# Patient Record
Sex: Male | Born: 1980 | Race: White | Hispanic: No | Marital: Single | Smoking: Former smoker
Health system: Southern US, Community
[De-identification: ages and names within clinical notes are randomized; demographics above are authoritative.]

## PROBLEM LIST (undated history)

## (undated) ENCOUNTER — Ambulatory Visit: Discharge status: Absent without leave | Payer: Medicaid Other

## (undated) DIAGNOSIS — J449 Chronic obstructive pulmonary disease, unspecified: Secondary | ICD-10-CM

## (undated) DIAGNOSIS — J45909 Unspecified asthma, uncomplicated: Secondary | ICD-10-CM

## (undated) DIAGNOSIS — D179 Benign lipomatous neoplasm, unspecified: Secondary | ICD-10-CM

---

## 2004-11-17 ENCOUNTER — Emergency Department: Payer: Self-pay | Admitting: Emergency Medicine

## 2005-03-22 ENCOUNTER — Emergency Department: Payer: Self-pay | Admitting: Emergency Medicine

## 2005-06-05 ENCOUNTER — Emergency Department: Payer: Self-pay | Admitting: Emergency Medicine

## 2007-02-12 ENCOUNTER — Emergency Department: Payer: Self-pay | Admitting: Emergency Medicine

## 2007-02-13 ENCOUNTER — Inpatient Hospital Stay: Payer: Self-pay | Admitting: Internal Medicine

## 2008-02-02 ENCOUNTER — Emergency Department: Payer: Self-pay | Admitting: Internal Medicine

## 2008-02-26 ENCOUNTER — Emergency Department: Payer: Self-pay | Admitting: Emergency Medicine

## 2008-03-24 ENCOUNTER — Emergency Department: Payer: Self-pay | Admitting: Emergency Medicine

## 2010-12-08 ENCOUNTER — Emergency Department (HOSPITAL_COMMUNITY)
Admission: EM | Admit: 2010-12-08 | Discharge: 2010-12-08 | Payer: Self-pay | Source: Home / Self Care | Admitting: Emergency Medicine

## 2011-01-05 ENCOUNTER — Emergency Department (HOSPITAL_COMMUNITY)
Admission: EM | Admit: 2011-01-05 | Discharge: 2011-01-05 | Payer: Self-pay | Source: Home / Self Care | Admitting: Emergency Medicine

## 2017-05-06 ENCOUNTER — Encounter: Payer: Self-pay | Admitting: Emergency Medicine

## 2017-05-06 ENCOUNTER — Ambulatory Visit
Admission: EM | Admit: 2017-05-06 | Discharge: 2017-05-06 | Disposition: A | Discharge status: Home / Self Care | Payer: Medicaid Other | Attending: Emergency Medicine | Admitting: Emergency Medicine

## 2017-05-06 DIAGNOSIS — R05 Cough: Secondary | ICD-10-CM

## 2017-05-06 DIAGNOSIS — J40 Bronchitis, not specified as acute or chronic: Secondary | ICD-10-CM

## 2017-05-06 DIAGNOSIS — R059 Cough, unspecified: Secondary | ICD-10-CM

## 2017-05-06 MED ORDER — HYDROCOD POLST-CPM POLST ER 10-8 MG/5ML PO SUER: 5.0000 mL | Freq: Two times a day (BID) | ORAL | 0 refills | Status: DC

## 2017-05-06 MED ORDER — BENZONATATE 200 MG PO CAPS: ORAL_CAPSULE | ORAL | 0 refills | Status: DC

## 2017-05-06 NOTE — ED Triage Notes (Signed)
Patient c/o cough and chest congestion for the past 4 days.  Patient reports sinus and nasal congestion for a week.

## 2017-05-06 NOTE — ED Provider Notes (Signed)
CSN: [id number]     Arrival date & time 05/06/17  1953 History   First MD Initiated Contact with Patient 05/06/17 2117     Chief Complaint  Patient presents with  . Cough  . Nasal Congestion   (Consider location/radiation/quality/duration/timing/severity/associated sxs/prior Treatment) HPI  This a 36 year old male who presents with cough and chest congestion for the past 4 days. States he's had sinus and nasal congestion for over a week. Sates this time each year he gets this almost without fail. Denies fever or chills. Been using over-the-counter medications without help. He states that he is miserable at nighttime is unable to sleep. Usually requires codeine cough syrup to allow him to get rest. Checked him on the [place] substance abuse registry and there are no red flags.        History reviewed. No pertinent past medical history. History reviewed. No pertinent surgical history. Family History  Problem Relation Age of Onset  . Diabetes Mother   . Cancer Father    Social History  Substance Use Topics  . Smoking status: Current Every Day Smoker    Types: Cigarettes  . Smokeless tobacco: Never Used  . Alcohol use Yes    Review of Systems  Constitutional: Positive for activity change. Negative for chills, fatigue and fever.  HENT: Positive for postnasal drip, sinus pressure and sore throat.   Respiratory: Positive for cough. Negative for shortness of breath, wheezing and stridor.   All other systems reviewed and are negative.   Allergies  Patient has no known allergies.  Home Medications   Prior to Admission medications   Medication Sig Start Date End Date Taking? Authorizing Provider  benzonatate (TESSALON) 200 MG capsule Take one cap TID PRN cough 05/06/17   Lorin Picket, PA-C  chlorpheniramine-HYDROcodone [facility]) 10-8 MG/5ML SUER Take 5 mLs by mouth 2 (two) times daily. 05/06/17   Lorin Picket, PA-C   Meds Ordered and Administered  this Visit  Medications - No data to display  BP 123/88 (BP Location: Left Arm)   Pulse 63   Temp 97.8 F (36.6 C) (Oral)   Resp 16   Ht 5\' 9"  (1.753 m)   Wt 155 lb (70.3 kg)   SpO2 100%   BMI 22.89 kg/m  No data found.   Physical Exam  Constitutional: He is oriented to person, place, and time. He appears well-developed and well-nourished. No distress.  HENT:  Head: Normocephalic.  Mouth/Throat: Oropharynx is clear and moist. No oropharyngeal exudate.  Both ear canals are occluded with cerumen. He has no significant tenderness over the sinuses to percussion. Oropharynx is benign  Eyes: Pupils are equal, round, and reactive to light. Right eye exhibits no discharge. Left eye exhibits no discharge.  Neck: Normal range of motion.  Pulmonary/Chest: Effort normal and breath sounds normal. No respiratory distress. He has no wheezes. He has no rales.  Musculoskeletal: Normal range of motion.  Lymphadenopathy:    He has no cervical adenopathy.  Neurological: He is alert and oriented to person, place, and time.  Skin: Skin is warm and dry. He is not diaphoretic.  Psychiatric: He has a normal mood and affect. His behavior is normal. Judgment and thought content normal.  Nursing note and vitals reviewed.   Urgent Care Course     Procedures (including critical care time)  Labs Review Labs Reviewed - No data to display  Imaging Review No results found.   Visual Acuity Review  Right Eye Distance:  Left Eye Distance:   Bilateral Distance:    Right Eye Near:   Left Eye Near:    Bilateral Near:         MDM   1. Bronchitis   2. Cough    Discharge Medication List as of 05/06/2017  9:33 PM    START taking these medications   Details  benzonatate (TESSALON) 200 MG capsule Take one cap TID PRN cough, Print    chlorpheniramine-HYDROcodone (TUSSIONEX PENNKINETIC ER) 10-8 MG/5ML SUER Take 5 mLs by mouth 2 (two) times daily., Starting Fri 05/06/2017, Print       Plan: 1. Test/x-ray results and diagnosis reviewed with patient 2. rx as per orders; risks, benefits, potential side effects reviewed with patient 3. Recommend supportive treatment with Use of Flonase and Zyrtec throughout the entire pollen season. Have also given him Tessalon Perles for daytime use. He is cautioned regarding use of the Tussionex with activities requiring judgment or concentration and not to drive for taking it. 4. F/u prn if symptoms worsen or don't improve     Lorin Picket, PA-C 05/06/17 2140

## 2019-12-02 ENCOUNTER — Ambulatory Visit
Admission: EM | Admit: 2019-12-02 | Discharge: 2019-12-02 | Disposition: A | Discharge status: Home / Self Care | Payer: Medicaid Other | Attending: Family Medicine | Admitting: Family Medicine

## 2019-12-02 ENCOUNTER — Other Ambulatory Visit: Payer: Self-pay

## 2019-12-02 ENCOUNTER — Encounter: Payer: Self-pay | Admitting: Emergency Medicine

## 2019-12-02 DIAGNOSIS — F411 Generalized anxiety disorder: Secondary | ICD-10-CM | POA: Insufficient documentation

## 2019-12-02 DIAGNOSIS — R002 Palpitations: Secondary | ICD-10-CM | POA: Diagnosis not present

## 2019-12-02 DIAGNOSIS — R0981 Nasal congestion: Secondary | ICD-10-CM | POA: Diagnosis not present

## 2019-12-02 DIAGNOSIS — Z20828 Contact with and (suspected) exposure to other viral communicable diseases: Secondary | ICD-10-CM

## 2019-12-02 DIAGNOSIS — Z20822 Contact with and (suspected) exposure to covid-19: Secondary | ICD-10-CM

## 2019-12-02 MED ORDER — CLONAZEPAM 0.5 MG PO TABS: 0.5000 mg | ORAL_TABLET | Freq: Two times a day (BID) | ORAL | 0 refills | Status: AC | PRN

## 2019-12-02 MED ORDER — IPRATROPIUM BROMIDE 0.06 % NA SOLN: 2.0000 | Freq: Four times a day (QID) | NASAL | 0 refills | Status: DC | PRN

## 2019-12-02 NOTE — ED Triage Notes (Signed)
Patient c/o sinus congestion and nasal congestion that started 2-3 days ago.  Patient c/o SOB and palpitation that started couple of hours ago.  Patient denies chest pain.  Patient was seen at [facility] ED and had normal EKG, negative chest x-ray and refused COVID test.

## 2019-12-02 NOTE — Discharge Instructions (Signed)
Rest.  Medication as needed.  Take care  Dr. Myrle Wanek  

## 2019-12-03 LAB — NOVEL CORONAVIRUS, NAA (HOSP ORDER, SEND-OUT TO REF LAB; TAT 18-24 HRS): SARS-CoV-2, NAA: NOT DETECTED

## 2019-12-03 NOTE — ED Provider Notes (Signed)
[facility]    CSN: [id number] Arrival date & time: 12/02/19  1535  History   Chief Complaint Chief Complaint  Patient presents with  . Sinus Problem  . Nasal Congestion  . Shortness of Breath    HPI  38 year old male presents with the above complaints.  Patient reports that symptoms started 2 to 3 days ago.  Patient reports sinus congestion, nasal congestion, shortness of breath, and palpitations.  Shortness of breath and palpitations started a few hours ago.  He has recently been seen at [facility].  Had EKG as well as chest x-ray at that time.  Refused Covid test.  Patient presents today for evaluation.  Continues to not feel well.  No fever.  He said that his symptoms are secondary to COVID-19.  No known exposures.  No known exacerbating or relieving factors.  No other complaints or concerns at this time.  Hx reviewed as below. PMH: Asthma, Anxiety  Home Medications    Prior to Admission medications   Medication Sig Start Date End Date Taking? Authorizing Provider  albuterol (VENTOLIN HFA) 108 (90 Base) MCG/ACT inhaler Inhale into the lungs. 06/01/19 05/31/20 Yes [provider]  famotidine (PEPCID) 20 MG tablet Take by mouth. 10/31/19 10/30/20 Yes [provider]  clonazePAM (KLONOPIN) 0.5 MG tablet Take 1 tablet (0.5 mg total) by mouth 2 (two) times daily as needed for anxiety. 12/02/19   Thersa Salt G, DO  ipratropium (ATROVENT) 0.06 % nasal spray Place 2 sprays into both nostrils 4 (four) times daily as needed for rhinitis. 12/02/19   Coral Spikes, DO    Family History Family History  Problem Relation Age of Onset  . Diabetes Mother   . Cancer Father     Social History Social History   Tobacco Use  . Smoking status: Former Smoker    Types: Cigarettes  . Smokeless tobacco: Never Used  Substance Use Topics  . Alcohol use: Yes  . Drug use: Yes    Types: Marijuana     Allergies   Patient has no known  allergies.   Review of Systems Review of Systems  Constitutional: Negative for fever.  HENT: Positive for congestion.   Respiratory: Positive for shortness of breath.   Cardiovascular: Positive for palpitations.   Physical Exam Triage Vital Signs ED Triage Vitals [12/02/19 1548]  Enc Vitals Group     BP 118/88     Pulse Rate 74     Resp 16     Temp 98.1 F (36.7 C)     Temp Source Oral     SpO2 100 %     Weight 155 lb (70.3 kg)     Height 5\' 9"  (1.753 m)     Head Circumference      Peak Flow      Pain Score 0     Pain Loc      Pain Edu?      Excl. in [place]?    Updated Vital Signs BP 118/88 (BP Location: Left Arm)   Pulse 74   Temp 98.1 F (36.7 C) (Oral)   Resp 16   Ht 5\' 9"  (1.753 m)   Wt 70.3 kg   SpO2 100%   BMI 22.89 kg/m   Visual Acuity Right Eye Distance:   Left Eye Distance:   Bilateral Distance:    Right Eye Near:   Left Eye Near:    Bilateral Near:     Physical Exam Vitals and nursing note reviewed.  Constitutional:      General: He is not in acute distress.    Appearance: Normal appearance. He is not ill-appearing.  HENT:     Head: Normocephalic and atraumatic.     Right Ear: Tympanic membrane normal.     Left Ear: Tympanic membrane normal.     Nose: No rhinorrhea.     Mouth/Throat:     Pharynx: Oropharynx is clear. No posterior oropharyngeal erythema.  Eyes:     General:        Right eye: No discharge.        Left eye: No discharge.     Conjunctiva/sclera: Conjunctivae normal.  Cardiovascular:     Rate and Rhythm: Normal rate and regular rhythm.     Heart sounds: No murmur.  Pulmonary:     Effort: Pulmonary effort is normal.     Breath sounds: Normal breath sounds. No wheezing, rhonchi or rales.  Skin:    General: Skin is warm.     Findings: No rash.  Neurological:     Mental Status: He is alert.  Psychiatric:        Mood and Affect: Mood normal.        Behavior: Behavior normal.    UC Treatments / Results  Labs (all labs  ordered are listed, but only abnormal results are displayed) Labs Reviewed  NOVEL CORONAVIRUS, NAA (HOSP ORDER, SEND-OUT TO REF LAB; TAT 18-24 HRS)    EKG Interpretation: Normal sinus rhythm with a rate of 65.  Normal axis.  Normal intervals.  No ST or T wave changes.  Normal EKG.  Radiology No results found.  Procedures Procedures (including critical care time)  Medications Ordered in UC Medications - No data to display  Initial Impression / Assessment and Plan / UC Course  I have reviewed the triage vital signs and the nursing notes.  Pertinent labs & imaging results that were available during my care of the patient were reviewed by me and considered in my medical decision making (see chart for details).    38 year old male presents with multiple complaints.  EKG normal.  Has had a recent chest x-ray which was normal.  He is well-appearing on exam.  Awaiting Covid test results.  I have a low suspicion that he has COVID-19.  Patient appears to be suffering from anxiety.  Short supply of Klonopin given.  Supportive care.  Atrovent nasal spray for congestion.  Final Clinical Impressions(s) / UC Diagnoses   Final diagnoses:  Palpitations  Sinus congestion  Encounter for laboratory testing for COVID-19 virus  Anxiety state     Discharge Instructions     Rest.  Medication as needed.  Take care  Dr. Lacinda Axon    ED Prescriptions    Medication Sig Dispense Auth. Provider   clonazePAM (KLONOPIN) 0.5 MG tablet Take 1 tablet (0.5 mg total) by mouth 2 (two) times daily as needed for anxiety. 10 tablet Copper Kirtley G, DO   ipratropium (ATROVENT) 0.06 % nasal spray Place 2 sprays into both nostrils 4 (four) times daily as needed for rhinitis. 15 mL Coral Spikes, DO     PDMP not reviewed this encounter.   Coral Spikes, [place] 12/03/19 1126

## 2021-03-10 ENCOUNTER — Ambulatory Visit
Admission: EM | Admit: 2021-03-10 | Discharge: 2021-03-10 | Disposition: A | Discharge status: Home / Self Care | Payer: Medicaid Other

## 2021-03-10 ENCOUNTER — Other Ambulatory Visit: Payer: Self-pay

## 2021-03-10 ENCOUNTER — Encounter: Payer: Self-pay | Admitting: Emergency Medicine

## 2021-03-10 DIAGNOSIS — J069 Acute upper respiratory infection, unspecified: Secondary | ICD-10-CM | POA: Diagnosis not present

## 2021-03-10 HISTORY — DX: Benign lipomatous neoplasm, unspecified: D17.9

## 2021-03-10 HISTORY — DX: Unspecified asthma, uncomplicated: J45.909

## 2021-03-10 HISTORY — DX: Chronic obstructive pulmonary disease, unspecified: J44.9

## 2021-03-10 MED ORDER — PROMETHAZINE-DM 6.25-15 MG/5ML PO SYRP: 5.0000 mL | ORAL_SOLUTION | Freq: Four times a day (QID) | ORAL | 0 refills | Status: DC | PRN

## 2021-03-10 MED ORDER — AEROCHAMBER MV MISC: 2 refills | Status: DC

## 2021-03-10 MED ORDER — DOXYCYCLINE HYCLATE 100 MG PO CAPS: 100.0000 mg | ORAL_CAPSULE | Freq: Two times a day (BID) | ORAL | 0 refills | Status: DC

## 2021-03-10 MED ORDER — AEROCHAMBER MV MISC: 2 refills | Status: AC

## 2021-03-10 NOTE — Discharge Instructions (Signed)
The doxycycline twice daily with food for 10 days.  Use the Promethazine DM cough syrup, 1 teaspoon every 6 hours, as needed for cough and congestion.  This will make you drowsy.  Perform sinus irrigation with a NeilMed sinus rinse kit and distilled water 2-3 times a day to help wash the mucus out of your sinuses and help resolve your infection.  She did use over-the-counter Mucinex to help break up the stickiness of your mucus and allow your body to clear it better.  Use your albuterol inhaler with spacer, 2 puffs every 4-6 hours, as needed for wheezing.  Return for new or worsening symptoms.

## 2021-03-10 NOTE — ED Triage Notes (Signed)
Patient c/o nasal congestion, sneezing, cough that started 3 days ago. He hadn't been taking his allergy medication in the last few days but did restart this.

## 2021-03-10 NOTE — ED Provider Notes (Signed)
[facility]    CSN: [id number] Arrival date & time: 03/10/21  1013      History   Chief Complaint Chief Complaint  Patient presents with  . Cough  . Nasal Congestion    HPI Murvin Schlick is a 40 y.o. male.   HPI   40 year old male here for evaluation of nasal congestion, cough, chest congestion.  Patient reports that he has been experiencing these symptoms for last 3 days.  Patient reports that he gets this same sort of presentation seasonally and is usually treated with cough medication and a round of antibiotics.  Patient reports that he has been using Mucinex to help break up the mucus but it is very thick and tenacious.  Patient has history of asthma and has been experiencing some wheezing for which she is using his rescue inhaler with success.  Patient also has had some ear pressure, green nasal discharge, productive cough for green sputum, but has not had any shortness of breath or fever.  Past Medical History:  Diagnosis Date  . Asthma   . COPD (chronic obstructive pulmonary disease) ([place])   . Multiple lipomas     There are no problems to display for this patient.   History reviewed. No pertinent surgical history.     Home Medications    Prior to Admission medications   Medication Sig Start Date End Date Taking? Authorizing Provider  albuterol (VENTOLIN HFA) 108 (90 Base) MCG/ACT inhaler Inhale into the lungs. 06/01/19 03/10/21 Yes [provider]  doxycycline (VIBRAMYCIN) 100 MG capsule Take 1 capsule (100 mg total) by mouth 2 (two) times daily. 03/10/21  Yes Margarette Canada, NP  fexofenadine-pseudoephedrine (ALLEGRA-D 24) 180-240 MG 24 hr tablet Take 1 tablet by mouth daily.   Yes [provider]  promethazine-dextromethorphan (PROMETHAZINE-DM) 6.25-15 MG/5ML syrup Take 5 mLs by mouth 4 (four) times daily as needed. 03/10/21  Yes Margarette Canada, NP  Spacer/Aero-Holding Chambers (AEROCHAMBER MV) inhaler Use as instructed 03/10/21  Yes  Margarette Canada, NP  clonazePAM (KLONOPIN) 0.5 MG tablet Take 1 tablet (0.5 mg total) by mouth 2 (two) times daily as needed for anxiety. 12/02/19   Coral Spikes, DO  famotidine (PEPCID) 20 MG tablet Take by mouth. 10/31/19 10/30/20  [provider]  ipratropium (ATROVENT) 0.06 % nasal spray Place 2 sprays into both nostrils 4 (four) times daily as needed for rhinitis. 12/02/19   Coral Spikes, DO    Family History Family History  Problem Relation Age of Onset  . Diabetes Mother   . Cancer Father     Social History Social History   Tobacco Use  . Smoking status: Former Smoker    Types: Cigarettes  . Smokeless tobacco: Never Used  Vaping Use  . Vaping Use: Never used  Substance Use Topics  . Alcohol use: Not Currently    Comment: social  . Drug use: Yes    Types: Marijuana     Allergies   Patient has no known allergies.   Review of Systems Review of Systems  Constitutional: Negative for activity change, appetite change and fever.  HENT: Positive for congestion, ear pain, postnasal drip and rhinorrhea. Negative for sore throat.   Respiratory: Positive for cough and wheezing. Negative for shortness of breath.   Skin: Negative for rash.  Hematological: Negative.   Psychiatric/Behavioral: Negative.      Physical Exam Triage Vital Signs ED Triage Vitals  Enc Vitals Group     BP 03/10/21 1131 (!) 124/95  Pulse Rate 03/10/21 1131 69     Resp 03/10/21 1131 18     Temp 03/10/21 1131 98 F (36.7 C)     Temp Source 03/10/21 1131 Oral     SpO2 03/10/21 1131 100 %     Weight 03/10/21 1131 150 lb (68 kg)     Height 03/10/21 1131 5' 9"  (1.753 m)     Head Circumference --      Peak Flow --      Pain Score 03/10/21 1129 0     Pain Loc --      Pain Edu? --      Excl. in [place]? --    No data found.  Updated Vital Signs BP (!) 124/95 (BP Location: Right Arm)   Pulse 69   Temp 98 F (36.7 C) (Oral)   Resp 18   Ht 5' 9"  (1.753 m)   Wt 150 lb (68 kg)   SpO2  100%   BMI 22.15 kg/m   Visual Acuity Right Eye Distance:   Left Eye Distance:   Bilateral Distance:    Right Eye Near:   Left Eye Near:    Bilateral Near:     Physical Exam Vitals and nursing note reviewed.  Constitutional:      General: He is not in acute distress.    Appearance: Normal appearance. He is ill-appearing.  HENT:     Head: Normocephalic and atraumatic.     Right Ear: Tympanic membrane, ear canal and external ear normal.     Left Ear: Tympanic membrane, ear canal and external ear normal.     Nose: Congestion and rhinorrhea present.     Mouth/Throat:     Pharynx: No posterior oropharyngeal erythema.  Cardiovascular:     Rate and Rhythm: Normal rate and regular rhythm.     Pulses: Normal pulses.     Heart sounds: Normal heart sounds. No murmur heard. No gallop.   Pulmonary:     Effort: Pulmonary effort is normal.     Breath sounds: Rales present. No wheezing or rhonchi.  Skin:    General: Skin is warm and dry.     Capillary Refill: Capillary refill takes less than 2 seconds.     Findings: No erythema or rash.  Neurological:     General: No focal deficit present.     Mental Status: He is alert and oriented to person, place, and time.  Psychiatric:        Mood and Affect: Mood normal.        Behavior: Behavior normal.        Thought Content: Thought content normal.        Judgment: Judgment normal.      UC Treatments / Results  Labs (all labs ordered are listed, but only abnormal results are displayed) Labs Reviewed - No data to display  EKG   Radiology No results found.  Procedures Procedures (including critical care time)  Medications Ordered in UC Medications - No data to display  Initial Impression / Assessment and Plan / UC Course  I have reviewed the triage vital signs and the nursing notes.  Pertinent labs & imaging results that were available during my care of the patient were reviewed by me and considered in my medical decision  making (see chart for details).   Sent but ill-appearing 40 year old male here for evaluation of respiratory complaints that been going on for the past 3 days.  Patient's physical exam reveals erythematous and edematous  nasal mucosa with clear nasal discharge.  Bilateral tympanic membranes are pearly gray with a normal light reflex in both external auditory canals are clear.  Patient's posterior oropharynx is unremarkable other than some clear postnasal drip.  Patient does have a strong infectious odor on his breath.  Lung sounds reveal some fine crackles in the right lung base.  Will cover patient for possible infectious sources with doxycycline twice daily for 10 days, will have him continue his Mucinex, perform sinus irrigation, and will give Promethazine DM for cough.   Final Clinical Impressions(s) / UC Diagnoses   Final diagnoses:  Upper respiratory tract infection, unspecified type     Discharge Instructions     The doxycycline twice daily with food for 10 days.  Use the Promethazine DM cough syrup, 1 teaspoon every 6 hours, as needed for cough and congestion.  This will make you drowsy.  Perform sinus irrigation with a NeilMed sinus rinse kit and distilled water 2-3 times a day to help wash the mucus out of your sinuses and help resolve your infection.  She did use over-the-counter Mucinex to help break up the stickiness of your mucus and allow your body to clear it better.  Use your albuterol inhaler with spacer, 2 puffs every 4-6 hours, as needed for wheezing.  Return for new or worsening symptoms.    ED Prescriptions    Medication Sig Dispense Auth. Provider   doxycycline (VIBRAMYCIN) 100 MG capsule Take 1 capsule (100 mg total) by mouth 2 (two) times daily. 20 capsule Margarette Canada, NP   promethazine-dextromethorphan (PROMETHAZINE-DM) 6.25-15 MG/5ML syrup Take 5 mLs by mouth 4 (four) times daily as needed. 118 mL Margarette Canada, NP   Spacer/Aero-Holding Josiah Lobo (AEROCHAMBER  MV) inhaler Use as instructed 1 each Margarette Canada, NP     PDMP not reviewed this encounter.   Margarette Canada, NP 03/10/21 1208

## 2021-05-12 ENCOUNTER — Ambulatory Visit
Admission: EM | Admit: 2021-05-12 | Discharge: 2021-05-12 | Disposition: A | Discharge status: Home / Self Care | Payer: Medicaid Other | Attending: Physician Assistant | Admitting: Physician Assistant

## 2021-05-12 ENCOUNTER — Encounter: Payer: Self-pay | Admitting: Emergency Medicine

## 2021-05-12 ENCOUNTER — Other Ambulatory Visit: Payer: Self-pay

## 2021-05-12 DIAGNOSIS — W57XXXA Bitten or stung by nonvenomous insect and other nonvenomous arthropods, initial encounter: Secondary | ICD-10-CM | POA: Diagnosis not present

## 2021-05-12 DIAGNOSIS — S70361A Insect bite (nonvenomous), right thigh, initial encounter: Secondary | ICD-10-CM

## 2021-05-12 DIAGNOSIS — M791 Myalgia, unspecified site: Secondary | ICD-10-CM

## 2021-05-12 DIAGNOSIS — R5383 Other fatigue: Secondary | ICD-10-CM

## 2021-05-12 MED ORDER — TRIAMCINOLONE ACETONIDE 0.1 % EX CREA: 1.0000 "application " | TOPICAL_CREAM | Freq: Two times a day (BID) | CUTANEOUS | 0 refills | Status: AC

## 2021-05-12 MED ORDER — DOXYCYCLINE HYCLATE 100 MG PO CAPS: 100.0000 mg | ORAL_CAPSULE | Freq: Two times a day (BID) | ORAL | 0 refills | Status: AC

## 2021-05-12 NOTE — Discharge Instructions (Signed)
Take over-the-counter ibuprofen or Aleve for your muscle pains and joint aches.  Rest and increase fluids.  I have sent a corticosteroid ointment for the tick bite sites.  This should help with swelling and discomfort.  I have also sent doxycycline to treat you for possible tickborne illnesses.  He should return if you feel worse or have fevers, increased redness/swelling/pain around the tick bite sites or weakness.  Go to ED for any severe acute worsening of your symptoms.

## 2021-05-12 NOTE — ED Triage Notes (Signed)
Patient states he was bit by a tick a few days ago in 2 separate areas. He is c/o joint pain that started 2 days ago.

## 2021-05-12 NOTE — ED Provider Notes (Signed)
[facility]    CSN: [id number] Arrival date & time: 05/12/21  0850      History   Chief Complaint Chief Complaint  Patient presents with  . Tick Removal    HPI Corey Cohen is a 40 y.o. male presenting for ~2 days of myalgias and arthralgias as well as fatigue.  Patient states that he was in the shower noticed a very small tick on his scrotum.  He says that he pulled this off yesterday.  He is unsure how it was there.  He also states that he found a very small tick on his posterior right thigh.  He does have some minimal redness and swelling in those areas with some itching.  Patient states that his symptoms of myalgias, arthralgias and fatigue started before he noticed he takes.  Patient is concerned for tickborne illnesses.  He has not had any fevers.  No body rashes.  No chest pain or breathing difficulty.  Has not taken any over-the-counter medications for his discomfort.  No other concerns.  HPI  Past Medical History:  Diagnosis Date  . Asthma   . COPD (chronic obstructive pulmonary disease) ([place])   . Multiple lipomas     There are no problems to display for this patient.   History reviewed. No pertinent surgical history.     Home Medications    Prior to Admission medications   Medication Sig Start Date End Date Taking? Authorizing Provider  clonazePAM (KLONOPIN) 0.5 MG tablet Take 1 tablet (0.5 mg total) by mouth 2 (two) times daily as needed for anxiety. 12/02/19  Yes Cook, Jayce G, DO  doxycycline (VIBRAMYCIN) 100 MG capsule Take 1 capsule (100 mg total) by mouth 2 (two) times daily for 14 days. 05/12/21 05/26/21 Yes Danton Clap, PA-C  fexofenadine-pseudoephedrine (ALLEGRA-D 24) 180-240 MG 24 hr tablet Take 1 tablet by mouth daily.   Yes [provider]  triamcinolone cream (KENALOG) 0.1 % Apply 1 application topically 2 (two) times daily for 10 days. 05/12/21 05/22/21 Yes Laurene Footman B, PA-C  albuterol (VENTOLIN HFA) 108 (90 Base) MCG/ACT  inhaler Inhale into the lungs. 06/01/19 03/10/21  [provider]  famotidine (PEPCID) 20 MG tablet Take by mouth. 10/31/19 10/30/20  [provider]  ipratropium (ATROVENT) 0.06 % nasal spray Place 2 sprays into both nostrils 4 (four) times daily as needed for rhinitis. 12/02/19   Coral Spikes, DO  promethazine-dextromethorphan (PROMETHAZINE-DM) 6.25-15 MG/5ML syrup Take 5 mLs by mouth 4 (four) times daily as needed. Patient taking differently: Take 5 mLs by mouth 4 (four) times daily as needed. 03/10/21   Margarette Canada, NP  Spacer/Aero-Holding Josiah Lobo (AEROCHAMBER MV) inhaler Use as instructed 03/10/21   Margarette Canada, NP    Family History Family History  Problem Relation Age of Onset  . Diabetes Mother   . Cancer Father     Social History Social History   Tobacco Use  . Smoking status: Former Smoker    Types: Cigarettes  . Smokeless tobacco: Never Used  Vaping Use  . Vaping Use: Never used  Substance Use Topics  . Alcohol use: Not Currently    Comment: social  . Drug use: Yes    Types: Marijuana     Allergies   Patient has no known allergies.   Review of Systems Review of Systems  Constitutional: Positive for fatigue. Negative for fever.  Respiratory: Negative for shortness of breath.   Cardiovascular: Negative for chest pain.  Gastrointestinal: Negative for nausea and  vomiting.  Musculoskeletal: Positive for arthralgias and myalgias. Negative for joint swelling.  Skin: Positive for color change and rash.  Neurological: Negative for dizziness and headaches.     Physical Exam Triage Vital Signs ED Triage Vitals  Enc Vitals Group     BP 05/12/21 0918 109/78     Pulse Rate 05/12/21 0918 (!) 57     Resp 05/12/21 0918 18     Temp 05/12/21 0918 98.1 F (36.7 C)     Temp Source 05/12/21 0918 Oral     SpO2 05/12/21 0918 100 %     Weight 05/12/21 0916 149 lb 14.6 oz (68 kg)     Height 05/12/21 0916 5\' 9"  (1.753 m)     Head Circumference --       Peak Flow --      Pain Score 05/12/21 0916 5     Pain Loc --      Pain Edu? --      Excl. in [place]? --    No data found.  Updated Vital Signs BP 109/78 (BP Location: Left Arm)   Pulse (!) 57   Temp 98.1 F (36.7 C) (Oral)   Resp 18   Ht 5\' 9"  (1.753 m)   Wt 149 lb 14.6 oz (68 kg)   SpO2 100%   BMI 22.14 kg/m       Physical Exam Vitals and nursing note reviewed.  Constitutional:      General: He is not in acute distress.    Appearance: Normal appearance. He is well-developed. He is not ill-appearing.  HENT:     Head: Normocephalic and atraumatic.  Eyes:     General: No scleral icterus.    Conjunctiva/sclera: Conjunctivae normal.  Cardiovascular:     Rate and Rhythm: Normal rate and regular rhythm.     Heart sounds: Normal heart sounds.  Pulmonary:     Effort: Pulmonary effort is normal. No respiratory distress.     Breath sounds: Normal breath sounds.  Musculoskeletal:     Cervical back: Neck supple.  Skin:    General: Skin is warm and dry.     Comments: There is a very small erythematous papule of the right posterior thigh that is mildly tender.  Neurological:     General: No focal deficit present.     Mental Status: He is alert. Mental status is at baseline.     Motor: No weakness.     Gait: Gait normal.  Psychiatric:        Mood and Affect: Mood normal.        Behavior: Behavior normal.        Thought Content: Thought content normal.      UC Treatments / Results  Labs (all labs ordered are listed, but only abnormal results are displayed) Labs Reviewed - No data to display  EKG   Radiology No results found.  Procedures Procedures (including critical care time)  Medications Ordered in UC Medications - No data to display  Initial Impression / Assessment and Plan / UC Course  I have reviewed the triage vital signs and the nursing notes.  Pertinent labs & imaging results that were available during my care of the patient were reviewed by me and  considered in my medical decision making (see chart for details).   40 year old male presenting for arthralgias, myalgias, and fatigue.  Also states he has multiple tick bites and is unsure of how long they were present on his body.  No obvious  signs of infection in the area of the tick bite.  The remainder the exam is within normal limits.  Patient did bring in 2 very small ticks but I am unable to identify what type of tick they are with certainty.  Treating patient for the possibility of tickborne illness at this time with doxycycline.  Also sent triamcinolone for his tick bites.  Encouraged him to use over-the-counter NSAIDs and Tylenol as needed for body aches and myalgias.  Reviewed when to return and when to go to ED.  Final Clinical Impressions(s) / UC Diagnoses   Final diagnoses:  Myalgia  Fatigue, unspecified type  Tick bite of right thigh, initial encounter     Discharge Instructions     Take over-the-counter ibuprofen or Aleve for your muscle pains and joint aches.  Rest and increase fluids.  I have sent a corticosteroid ointment for the tick bite sites.  This should help with swelling and discomfort.  I have also sent doxycycline to treat you for possible tickborne illnesses.  He should return if you feel worse or have fevers, increased redness/swelling/pain around the tick bite sites or weakness.  Go to ED for any severe acute worsening of your symptoms.    ED Prescriptions    Medication Sig Dispense Auth. Provider   doxycycline (VIBRAMYCIN) 100 MG capsule Take 1 capsule (100 mg total) by mouth 2 (two) times daily for 14 days. 28 capsule Laurene Footman B, PA-C   triamcinolone cream (KENALOG) 0.1 % Apply 1 application topically 2 (two) times daily for 10 days. 30 g Gretta Cool     PDMP not reviewed this encounter.   Danton Clap, PA-C 05/12/21 [phone]

## 2022-03-22 ENCOUNTER — Other Ambulatory Visit: Payer: Self-pay

## 2022-03-22 ENCOUNTER — Emergency Department
Admission: EM | Admit: 2022-03-22 | Discharge: 2022-03-22 | Disposition: A | Discharge status: Home / Self Care | Payer: Medicaid Other | Attending: Emergency Medicine | Admitting: Emergency Medicine

## 2022-03-22 ENCOUNTER — Emergency Department: Payer: Medicaid Other

## 2022-03-22 DIAGNOSIS — S59912A Unspecified injury of left forearm, initial encounter: Secondary | ICD-10-CM | POA: Insufficient documentation

## 2022-03-22 DIAGNOSIS — Y92410 Unspecified street and highway as the place of occurrence of the external cause: Secondary | ICD-10-CM | POA: Insufficient documentation

## 2022-03-22 DIAGNOSIS — S0990XA Unspecified injury of head, initial encounter: Secondary | ICD-10-CM | POA: Insufficient documentation

## 2022-03-22 DIAGNOSIS — M542 Cervicalgia: Secondary | ICD-10-CM | POA: Diagnosis not present

## 2022-03-22 MED ORDER — ONDANSETRON 4 MG PO TBDP: 4.0000 mg | ORAL_TABLET | Freq: Three times a day (TID) | ORAL | 0 refills | Status: AC | PRN

## 2022-03-22 MED ORDER — METHOCARBAMOL 500 MG PO TABS
500.0000 mg | ORAL_TABLET | Freq: Three times a day (TID) | ORAL | 0 refills | Status: AC | PRN
Start: 2022-03-22 — End: 2022-03-27
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
## 2022-03-22 NOTE — Discharge Instructions (Addendum)
You can take Robaxin up to three times daily.  ?
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
## 2022-03-29 ENCOUNTER — Ambulatory Visit
Admission: EM | Admit: 2022-03-29 | Discharge: 2022-03-29 | Disposition: A | Discharge status: Home / Self Care | Payer: Medicaid Other

## 2022-03-29 NOTE — ED Triage Notes (Signed)
No answer in lobby, unable to locate outside ?

## 2022-05-31 ENCOUNTER — Ambulatory Visit
Admission: EM | Admit: 2022-05-31 | Discharge: 2022-05-31 | Disposition: A | Discharge status: Home / Self Care | Payer: Medicaid Other | Attending: Emergency Medicine | Admitting: Emergency Medicine

## 2022-05-31 DIAGNOSIS — S90461A Insect bite (nonvenomous), right great toe, initial encounter: Secondary | ICD-10-CM

## 2022-05-31 DIAGNOSIS — W57XXXA Bitten or stung by nonvenomous insect and other nonvenomous arthropods, initial encounter: Secondary | ICD-10-CM | POA: Diagnosis not present

## 2022-05-31 DIAGNOSIS — L03031 Cellulitis of right toe: Secondary | ICD-10-CM

## 2022-05-31 MED ORDER — DOXYCYCLINE HYCLATE 100 MG PO CAPS: 100.0000 mg | ORAL_CAPSULE | Freq: Two times a day (BID) | ORAL | 0 refills | Status: DC

## 2022-05-31 NOTE — ED Triage Notes (Signed)
Pt reports he pull a tick off his great toe and now it looks infected.

## 2022-05-31 NOTE — ED Provider Notes (Signed)
[facility]    CSN: [id number] Arrival date & time: 05/31/22  1350      History   Chief Complaint Chief Complaint  Patient presents with   Tick Removal    HPI Corey Cohen is a 41 y.o. male.   HPI  41 year old male here for evaluation of tick bite.  Patient reports that he pulled a tick off of the proximal cuticle of his right great toenail approximately 2 weeks ago.  In the last week he has developed redness around the proximal cuticle and the area has become more sore.  He denies any fever or drainage.  Past Medical History:  Diagnosis Date   Asthma    COPD (chronic obstructive pulmonary disease) ([place])    Multiple lipomas     There are no problems to display for this patient.   No past surgical history on file.     Home Medications    Prior to Admission medications   Medication Sig Start Date End Date Taking? Authorizing Provider  doxycycline (VIBRAMYCIN) 100 MG capsule Take 1 capsule (100 mg total) by mouth 2 (two) times daily. 05/31/22  Yes Margarette Canada, NP  albuterol (VENTOLIN HFA) 108 (90 Base) MCG/ACT inhaler Inhale into the lungs. 06/01/19 03/10/21  [provider]  clonazePAM (KLONOPIN) 0.5 MG tablet Take 1 tablet (0.5 mg total) by mouth 2 (two) times daily as needed for anxiety. 12/02/19   Coral Spikes, DO  famotidine (PEPCID) 20 MG tablet Take by mouth. 10/31/19 10/30/20  [provider]  fexofenadine-pseudoephedrine (ALLEGRA-D 24) 180-240 MG 24 hr tablet Take 1 tablet by mouth daily.    [provider]  ipratropium (ATROVENT) 0.06 % nasal spray Place 2 sprays into both nostrils 4 (four) times daily as needed for rhinitis. 12/02/19   Coral Spikes, DO  promethazine-dextromethorphan (PROMETHAZINE-DM) 6.25-15 MG/5ML syrup Take 5 mLs by mouth 4 (four) times daily as needed. Patient taking differently: Take 5 mLs by mouth 4 (four) times daily as needed. 03/10/21   Margarette Canada, NP  Spacer/Aero-Holding Josiah Lobo  (AEROCHAMBER MV) inhaler Use as instructed 03/10/21   Margarette Canada, NP    Family History Family History  Problem Relation Age of Onset   Diabetes Mother    Cancer Father     Social History Social History   Tobacco Use   Smoking status: Former    Types: Cigarettes   Smokeless tobacco: Never  Vaping Use   Vaping Use: Never used  Substance Use Topics   Alcohol use: Not Currently    Comment: social   Drug use: Yes    Types: Marijuana     Allergies   Patient has no known allergies.   Review of Systems Review of Systems  Constitutional:  Negative for fever.  Skin:  Positive for color change and wound.  Hematological: Negative.   Psychiatric/Behavioral: Negative.       Physical Exam Triage Vital Signs ED Triage Vitals [05/31/22 1420]  Enc Vitals Group     BP 107/82     Pulse Rate 61     Resp 16     Temp 98.7 F (37.1 C)     Temp Source Oral     SpO2 100 %     Weight      Height      Head Circumference      Peak Flow      Pain Score      Pain Loc      Pain Edu?  Excl. in [place]?    No data found.  Updated Vital Signs BP 107/82 (BP Location: Left Arm)   Pulse 61   Temp 98.7 F (37.1 C) (Oral)   Resp 16   SpO2 100%   Visual Acuity Right Eye Distance:   Left Eye Distance:   Bilateral Distance:    Right Eye Near:   Left Eye Near:    Bilateral Near:     Physical Exam Vitals and nursing note reviewed.  Constitutional:      Appearance: Normal appearance. He is not ill-appearing.  HENT:     Head: Normocephalic and atraumatic.  Skin:    General: Skin is warm and dry.     Capillary Refill: Capillary refill takes less than 2 seconds.     Findings: Erythema present. No rash.  Neurological:     General: No focal deficit present.     Mental Status: He is alert and oriented to person, place, and time.  Psychiatric:        Mood and Affect: Mood normal.        Behavior: Behavior normal.        Thought Content: Thought content normal.         Judgment: Judgment normal.      UC Treatments / Results  Labs (all labs ordered are listed, but only abnormal results are displayed) Labs Reviewed - No data to display  EKG   Radiology No results found.  Procedures Procedures (including critical care time)  Medications Ordered in UC Medications - No data to display  Initial Impression / Assessment and Plan / UC Course  I have reviewed the triage vital signs and the nursing notes.  Pertinent labs & imaging results that were available during my care of the patient were reviewed by me and considered in my medical decision making (see chart for details).  Patient is a nontoxic-appearing 41 year old male here for evaluation of redness to the proximal aspect of the cuticle of the right great toe that is been present for the past week.  The patient was bitten in the same location approximately 2 weeks ago by a tick.  There is a scabbed area where the tick bit him to the lateral aspect of the proximal cuticle.  The area of redness is not indurated or fluctuant.  It is mildly hot to touch.  The redness is easily blanchable.  There is no swelling or pain in the IP joint or proximal phalanx of the right great toe.  DP and PT pulses in the right foot are 2+.  Patient exam is consistent with a developing paronychia.  This may or may not be secondary to the tick bite.  I will treat the patient with doxycycline twice daily for 10 days and I have encouraged him to soak his foot in warm water and Epsom salts to draw out any potential infection and keep a pus pocket from forming.  Return precautions reviewed.   Final Clinical Impressions(s) / UC Diagnoses   Final diagnoses:  Paronychia of fifth toe, right  Tick bite of right great toe, initial encounter     Discharge Instructions      Take the Doxycycline twice daily with food for 10 days.  Doxycycline will make you more sensitive to sunburn so wear sunscreen when outdoors and reapply it  every 90 minutes.  Apply warm compresses to help promote drainage.  Use OTC Tylenol and Ibuprofen according to the package instructions as needed for pain.  Return  for new or worsening symptoms.       ED Prescriptions     Medication Sig Dispense Auth. Provider   doxycycline (VIBRAMYCIN) 100 MG capsule Take 1 capsule (100 mg total) by mouth 2 (two) times daily. 20 capsule Margarette Canada, NP      PDMP not reviewed this encounter.   Margarette Canada, NP 05/31/22 1441

## 2022-05-31 NOTE — Discharge Instructions (Signed)
Take the Doxycycline twice daily with food for 10 days.  Doxycycline will make you more sensitive to sunburn so wear sunscreen when outdoors and reapply it every 90 minutes.  Apply warm compresses to help promote drainage.  Use OTC Tylenol and Ibuprofen according to the package instructions as needed for pain.  Return for new or worsening symptoms.   
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
## 2023-11-24 ENCOUNTER — Ambulatory Visit: Payer: Medicaid Other

## 2023-11-24 ENCOUNTER — Ambulatory Visit
Admission: EM | Admit: 2023-11-24 | Discharge: 2023-11-24 | Disposition: A | Discharge status: Home / Self Care | Payer: Medicaid Other | Attending: Emergency Medicine | Admitting: Emergency Medicine

## 2023-11-24 DIAGNOSIS — J069 Acute upper respiratory infection, unspecified: Secondary | ICD-10-CM

## 2023-11-24 MED ORDER — IPRATROPIUM BROMIDE 0.06 % NA SOLN: 2.0000 | Freq: Four times a day (QID) | NASAL | 0 refills | Status: AC | PRN

## 2023-11-24 NOTE — ED Provider Notes (Addendum)
[facility]    CSN: [id number] Arrival date & time: 11/24/23  1052      History   Chief Complaint Chief Complaint  Patient presents with   lung pain   Nasal Congestion   Back Pain    HPI Corey Cohen is a 42 y.o. male.   HPI  42 year old male with past medical history significant for multiple lipomas, COPD, and asthma presents for evaluation of 4 days worth of respiratory symptoms to include runny nose, nasal congestion, postnasal drip, and scratchy throat.  He states this morning he woke up and he had pain in the left side of his chest with deep respiration.  He denies any cough, shortness breath, or wheezing.  He also denies fever.  He is concerned about possible pneumonia.  Past Medical History:  Diagnosis Date   Asthma    COPD (chronic obstructive pulmonary disease) (HCC)    Multiple lipomas     There are no problems to display for this patient.   History reviewed. No pertinent surgical history.     Home Medications    Prior to Admission medications   Medication Sig Start Date End Date Taking? Authorizing Provider  albuterol (VENTOLIN HFA) 108 (90 Base) MCG/ACT inhaler Inhale into the lungs. 06/01/19 03/10/21  [provider]  clonazePAM (KLONOPIN) 0.5 MG tablet Take 1 tablet (0.5 mg total) by mouth 2 (two) times daily as needed for anxiety. 12/02/19   Tommie Sams, DO  famotidine (PEPCID) 20 MG tablet Take by mouth. 10/31/19 10/30/20  [provider]  fexofenadine-pseudoephedrine (ALLEGRA-D 24) 180-240 MG 24 hr tablet Take 1 tablet by mouth daily.    [provider]  ipratropium (ATROVENT) 0.06 % nasal spray Place 2 sprays into both nostrils 4 (four) times daily as needed for rhinitis. 11/24/23   Becky Augusta, NP  Spacer/Aero-Holding Deretha Emory (AEROCHAMBER MV) inhaler Use as instructed 03/10/21   Becky Augusta, NP    Family History Family History  Problem Relation Age of Onset   Diabetes Mother    Cancer Father      Social History Social History   Tobacco Use   Smoking status: Former    Types: Cigarettes   Smokeless tobacco: Never  Vaping Use   Vaping status: Never Used  Substance Use Topics   Alcohol use: Not Currently    Comment: social   Drug use: Yes    Types: Marijuana     Allergies   Patient has no known allergies.   Review of Systems Review of Systems  Constitutional:  Negative for fever.  HENT:  Positive for congestion, postnasal drip, rhinorrhea and sore throat. Negative for ear pain.        Scratchy throat.  Respiratory:  Negative for cough, shortness of breath and wheezing.   Cardiovascular:  Positive for chest pain.       Pleuritic left chest pain.     Physical Exam Triage Vital Signs ED Triage Vitals  Encounter Vitals Group     BP      Systolic BP Percentile      Diastolic BP Percentile      Pulse      Resp      Temp      Temp src      SpO2      Weight      Height      Head Circumference      Peak Flow      Pain Score  Pain Loc      Pain Education      Exclude from Growth Chart    No data found.  Updated Vital Signs BP 111/82 (BP Location: Left Arm)   Pulse 69   Temp 97.8 F (36.6 C) (Oral)   Resp 18   SpO2 100%   Visual Acuity Right Eye Distance:   Left Eye Distance:   Bilateral Distance:    Right Eye Near:   Left Eye Near:    Bilateral Near:     Physical Exam Vitals and nursing note reviewed.  Constitutional:      Appearance: Normal appearance. He is not ill-appearing.  HENT:     Head: Normocephalic and atraumatic.     Right Ear: Tympanic membrane, ear canal and external ear normal. There is no impacted cerumen.     Left Ear: Tympanic membrane, ear canal and external ear normal. There is no impacted cerumen.     Nose: Congestion and rhinorrhea present.     Comments: Nasal mucosa is mildly edematous with clear discharge in both nares.    Mouth/Throat:     Mouth: Mucous membranes are moist.     Pharynx: Oropharynx is  clear. Posterior oropharyngeal erythema present. No oropharyngeal exudate.     Comments: Mild erythema to the posterior oropharynx with clear postnasal drip. Cardiovascular:     Rate and Rhythm: Normal rate and regular rhythm.     Pulses: Normal pulses.     Heart sounds: Normal heart sounds. No murmur heard.    No friction rub. No gallop.  Pulmonary:     Effort: Pulmonary effort is normal.     Breath sounds: Normal breath sounds. No wheezing, rhonchi or rales.  Musculoskeletal:     Cervical back: Normal range of motion and neck supple. No tenderness.  Lymphadenopathy:     Cervical: No cervical adenopathy.  Skin:    General: Skin is warm and dry.     Capillary Refill: Capillary refill takes less than 2 seconds.     Findings: No rash.  Neurological:     General: No focal deficit present.     Mental Status: He is alert and oriented to person, place, and time.      UC Treatments / Results  Labs (all labs ordered are listed, but only abnormal results are displayed) Labs Reviewed - No data to display  EKG   Radiology No results found.  Procedures Procedures (including critical care time)  Medications Ordered in UC Medications - No data to display  Initial Impression / Assessment and Plan / UC Course  I have reviewed the triage vital signs and the nursing notes.  Pertinent labs & imaging results that were available during my care of the patient were reviewed by me and considered in my medical decision making (see chart for details).   Patient is a pleasant, nontoxic-appearing 42 year old male presenting for evaluation of left lower pleuritic chest pain that started this morning when he woke up in the setting of 4 days worth of URI symptoms.  No fever or cough.  He is able to speak in full sentence without dyspnea or tachypnea.  Taking a deep breath does trigger the pain in his left lung base.  He does have a history of COPD and asthma but no appreciable wheezing on  auscultation of the chest wall.  I will obtain chest radiograph to rule out any acute cardiopulmonary pathology.  Chest x-ray independently reviewed and evaluated by me.  Impression: Lung fields  are well aerated.  Cardiomediastinal silhouette appears normal.  No evidence of infiltrate or effusion.  Radiology overread is pending. States no active cardiopulmonary disease.  I will discharge patient with a diagnosis of viral URI with a prescription for Atrovent nasal spray to help with nasal congestion.  Patient can use over-the-counter Tylenol and/or ibuprofen according the package instructions as needed for pain.  Return precautions reviewed.  Work note provided.   Final Clinical Impressions(s) / UC Diagnoses   Final diagnoses:  Viral upper respiratory tract infection     Discharge Instructions      Your exam today was consistent with a viral respiratory infection.  Your chest x-ray did not show any evidence of pneumonia.  Use the Atrovent nasal spray, 2 squirts up each nostril every 6 hours, as needed for runny nose, nasal congestion, and postnasal drip.  You may use over-the-counter Tylenol and/or ibuprofen according the package instructions as needed for pain.  If you develop any new or worsening symptoms either return for reevaluation or follow-up with your primary care provider.     ED Prescriptions     Medication Sig Dispense Auth. Provider   ipratropium (ATROVENT) 0.06 % nasal spray Place 2 sprays into both nostrils 4 (four) times daily as needed for rhinitis. 15 mL Becky Augusta, NP      PDMP not reviewed this encounter.   Becky Augusta, NP 11/24/23 1243    Becky Augusta, NP 11/24/23 1434

## 2023-11-24 NOTE — ED Triage Notes (Addendum)
Patient states that sx started 4 days ago.posterior nasal drip,irritated throat-nasal congestion,   Patient states that he woke up this morning with left lung pain when he breathes in deep. Hx of lipoma.    Covid 2 yrs ago.

## 2023-11-24 NOTE — Discharge Instructions (Signed)
Your exam today was consistent with a viral respiratory infection.  Your chest x-ray did not show any evidence of pneumonia.  Use the Atrovent nasal spray, 2 squirts up each nostril every 6 hours, as needed for runny nose, nasal congestion, and postnasal drip.  You may use over-the-counter Tylenol and/or ibuprofen according the package instructions as needed for pain.  If you develop any new or worsening symptoms either return for reevaluation or follow-up with your primary care provider.
